# Patient Record
Sex: Female | Born: 1985 | Race: Black or African American | Hispanic: No | Marital: Married | Smoking: Never smoker
Health system: Southern US, Community
[De-identification: ages and names within clinical notes are randomized; demographics above are authoritative.]

## PROBLEM LIST (undated history)

## (undated) DIAGNOSIS — J309 Allergic rhinitis, unspecified: Secondary | ICD-10-CM

## (undated) DIAGNOSIS — J45909 Unspecified asthma, uncomplicated: Secondary | ICD-10-CM

## (undated) HISTORY — DX: Allergic rhinitis, unspecified: J30.9

## (undated) HISTORY — DX: Unspecified asthma, uncomplicated: J45.909

---

## 2013-08-19 ENCOUNTER — Emergency Department (HOSPITAL_COMMUNITY)
Admission: EM | Admit: 2013-08-19 | Discharge: 2013-08-19 | Disposition: A | Discharge status: Home / Self Care | Payer: BC Managed Care – PPO | Source: Home / Self Care | Attending: Family Medicine | Admitting: Family Medicine

## 2013-08-19 ENCOUNTER — Encounter (HOSPITAL_COMMUNITY): Payer: Self-pay | Admitting: Emergency Medicine

## 2013-08-19 DIAGNOSIS — K5289 Other specified noninfective gastroenteritis and colitis: Secondary | ICD-10-CM

## 2013-08-19 DIAGNOSIS — K529 Noninfective gastroenteritis and colitis, unspecified: Secondary | ICD-10-CM

## 2013-08-19 MED ORDER — ALIGN 4 MG PO CAPS: 1.0000 | ORAL_CAPSULE | Freq: Every day | ORAL | Status: DC

## 2013-08-19 MED ORDER — ONDANSETRON HCL 4 MG PO TABS: 4.0000 mg | ORAL_TABLET | Freq: Three times a day (TID) | ORAL | Status: DC | PRN

## 2013-08-19 NOTE — ED Provider Notes (Signed)
CSN: [id number]     Arrival date & time 08/19/13  1242 History   First MD Initiated Contact with Patient 08/19/13 1337     Chief Complaint  Patient presents with  . Abdominal Pain   (Consider location/radiation/quality/duration/timing/severity/associated sxs/prior Treatment) HPI Comments: Patient present with a 3 day history of abdominal cramping, nausea and diarrhea, fever/chills, and slight headache. No myalgias or URI sx. No known ill contacts. States her abdomen becomes "crampy and noisy" with meal intake. Denies GU symptoms. Non-bloody diarrhea. No recent travel, antibiotic use or raw meat/seafood.    No past medical history on file. No past surgical history on file. No family history on file. History  Substance Use Topics  . Smoking status: Not on file  . Smokeless tobacco: Not on file  . Alcohol Use: Not on file   OB History   No data available     Review of Systems  Constitutional: Positive for fever, chills and appetite change.  HENT: Negative.   Eyes: Negative.   Respiratory: Negative.   Cardiovascular: Negative.   Gastrointestinal: Positive for nausea and diarrhea. Negative for vomiting, constipation and blood in stool.       See HPI  Endocrine: Negative for polydipsia, polyphagia and polyuria.  Genitourinary: Negative.   Musculoskeletal: Negative.   Skin: Negative.   Neurological: Negative.     Allergies  Review of patient's allergies indicates no known allergies.  Home Medications  No current outpatient prescriptions on file. BP 114/74  Pulse 71  Temp(Src) 98.4 F (36.9 C) (Oral)  Resp 16  SpO2 100% Physical Exam  ED Course  Procedures (including critical care time) Labs Review Labs Reviewed - No data to display Imaging Review No results found.    MDM  Will treat with zofran, BRAT diet and Align and advised patient she can expect gradual resolution over next 2-3 days. Cautioned if she develops increasing pain, fever, persistent vomiting, or  blood in stool, she should be re-evaluated.     Jess BartersJennifer Lee [place], [place] 08/19/13 [address]1408  Dshawn Mcnay Lee [place], [place] 08/19/13 [phone]

## 2013-08-19 NOTE — ED Notes (Signed)
C/o abdominal pain, cramping, worse with eating.  Reports diarrhea and  nausea, no vomiting since onset of symptoms on Sunday.  Today has had no diarrhea, but soft stool and no vomiting

## 2013-08-27 NOTE — ED Provider Notes (Signed)
Medical screening examination/treatment/procedure(s) were performed by resident physician or non-physician practitioner and as supervising physician I was immediately available for consultation/collaboration.   Jachelle Fluty DOUGLAS MD.   Elsie Baynes D Alisha Burgo, MD 08/27/13 1756 

## 2017-04-26 ENCOUNTER — Ambulatory Visit (HOSPITAL_COMMUNITY)
Admission: EM | Admit: 2017-04-26 | Discharge: 2017-04-26 | Disposition: A | Discharge status: Home / Self Care | Payer: Self-pay

## 2017-10-12 ENCOUNTER — Ambulatory Visit: Payer: BC Managed Care – PPO | Admitting: Pulmonary Disease

## 2017-10-12 ENCOUNTER — Other Ambulatory Visit (INDEPENDENT_AMBULATORY_CARE_PROVIDER_SITE_OTHER): Payer: BC Managed Care – PPO

## 2017-10-12 ENCOUNTER — Telehealth: Payer: Self-pay | Admitting: Pulmonary Disease

## 2017-10-12 ENCOUNTER — Encounter: Payer: Self-pay | Admitting: Pulmonary Disease

## 2017-10-12 ENCOUNTER — Ambulatory Visit (INDEPENDENT_AMBULATORY_CARE_PROVIDER_SITE_OTHER)
Admission: RE | Admit: 2017-10-12 | Discharge: 2017-10-12 | Disposition: A | Discharge status: Home / Self Care | Payer: BC Managed Care – PPO | Source: Ambulatory Visit | Attending: Pulmonary Disease | Admitting: Pulmonary Disease

## 2017-10-12 VITALS — BP 124/72 | HR 84 | Ht 65.0 in | Wt 172.0 lb

## 2017-10-12 DIAGNOSIS — R05 Cough: Secondary | ICD-10-CM

## 2017-10-12 DIAGNOSIS — R059 Cough, unspecified: Secondary | ICD-10-CM

## 2017-10-12 LAB — CBC WITH DIFFERENTIAL/PLATELET
BASOS ABS: 0 10*3/uL (ref 0.0–0.1)
Basophils Relative: 0.4 % (ref 0.0–3.0)
Eosinophils Absolute: 0 10*3/uL (ref 0.0–0.7)
Eosinophils Relative: 0.8 % (ref 0.0–5.0)
HEMATOCRIT: 40.5 % (ref 36.0–46.0)
Hemoglobin: 13.7 g/dL (ref 12.0–15.0)
LYMPHS PCT: 25.4 % (ref 12.0–46.0)
Lymphs Abs: 1.5 10*3/uL (ref 0.7–4.0)
MCHC: 33.8 g/dL (ref 30.0–36.0)
MCV: 86.3 fl (ref 78.0–100.0)
MONOS PCT: 5.7 % (ref 3.0–12.0)
Monocytes Absolute: 0.3 10*3/uL (ref 0.1–1.0)
Neutro Abs: 4.1 10*3/uL (ref 1.4–7.7)
Neutrophils Relative %: 67.7 % (ref 43.0–77.0)
PLATELETS: 228 10*3/uL (ref 150.0–400.0)
RBC: 4.7 Mil/uL (ref 3.87–5.11)
RDW: 13.6 % (ref 11.5–15.5)
WBC: 6 10*3/uL (ref 4.0–10.5)

## 2017-10-12 LAB — NITRIC OXIDE: Nitric Oxide: 10

## 2017-10-12 MED ORDER — FLUTICASONE PROPIONATE 50 MCG/ACT NA SUSP: 2.0000 | Freq: Every day | NASAL | 2 refills | Status: DC

## 2017-10-12 MED ORDER — AZELASTINE HCL 0.1 % NA SOLN: 2.0000 | Freq: Two times a day (BID) | NASAL | 12 refills | Status: DC

## 2017-10-12 MED ORDER — CHLORPHENIRAMINE MALEATE 4 MG PO TABS: 8.0000 mg | ORAL_TABLET | Freq: Three times a day (TID) | ORAL | 0 refills | Status: DC

## 2017-10-12 MED ORDER — OMEPRAZOLE 40 MG PO CPDR: 40.0000 mg | DELAYED_RELEASE_CAPSULE | Freq: Every day | ORAL | 5 refills | Status: DC

## 2017-10-12 NOTE — Progress Notes (Signed)
Brandy MohCrystal Lehrmann    [id number]    18-Feb-1986  Primary Care Physician:Bland, Adrian SaranVeita, MD  Referring Physician: No referring provider defined for this encounter.  Chief complaint: Chronic cough  HPI: 32 year old with no significant past medical history.  Had a viral respiratory tract infection after a trip to [place] in January 2019.  He had a persistent cough since then.  Cough is nonproductive in nature associated with dyspnea.  Denies any wheeze.  No fevers, chills Seen by [facility] care and pul[facility] at [facility] and started on Symbicort which is not helping.  Reportedly had a chest x-ray in January which was normal She has history of childhood asthma with infrequent flares.  Reports worsening seasonal allergies, no GERD symptoms  Pets: One dog, no cats, birds Occupation: Professor of public health at [employer] Exposures: No mold, dampness.  Has a hot tub but does not use it Smoking history: Never smoker Travel History: Traveled to [place] in January 2019.  No other significant travel  Outpatient Encounter Medications as of 10/12/2017  Medication Sig  . benzonatate (TESSALON) 100 MG capsule TAKE 1 CAPSULE BY MOUTH THREE TIMES A DAY  . guaiFENesin-codeine 100-10 MG/5ML syrup TAKE 5MLS EVERY 12 HOURS AS NEEDED FOR COUGH  . ondansetron (ZOFRAN) 4 MG tablet Take 1 tablet (4 mg total) by mouth every 8 (eight) hours as needed for nausea or vomiting.  . SYMBICORT 80-4.5 MCG/ACT inhaler   . [DISCONTINUED] UNABLE TO FIND Red onions, pecans.  . [DISCONTINUED] Probiotic Product (ALIGN) 4 MG CAPS Take 1 capsule (4 mg total) by mouth daily. X 7 days   No facility-administered encounter medications on file as of 10/12/2017.     Allergies as of 10/12/2017 - Review Complete 08/19/2013  Allergen Reaction Noted  . Pecan nut (diagnostic)  10/12/2017    Past Medical History:  Diagnosis Date  . Allergic rhinitis   . Asthma     Past Surgical History:  Procedure Laterality Date  .  CHOLECYSTECTOMY      Family History  Problem Relation Age of Onset  . Breast cancer Maternal Grandmother   . Heart attack Maternal Grandmother   . Hypertension Maternal Grandmother     Social History   Socioeconomic History  . Marital status: Married    Spouse name: Not on file  . Number of children: Not on file  . Years of education: Not on file  . Highest education level: Not on file  Occupational History  . Not on file  Social Needs  . Financial resource strain: Not on file  . Food insecurity:    Worry: Not on file    Inability: Not on file  . Transportation needs:    Medical: Not on file    Non-medical: Not on file  Tobacco Use  . Smoking status: Never Smoker  . Smokeless tobacco: Never Used  Substance and Sexual Activity  . Alcohol use: No  . Drug use: No  . Sexual activity: Yes  Lifestyle  . Physical activity:    Days per week: Not on file    Minutes per session: Not on file  . Stress: Not on file  Relationships  . Social connections:    Talks on phone: Not on file    Gets together: Not on file    Attends religious service: Not on file    Active member of club or organization: Not on file    Attends meetings of clubs or organizations: Not  on file    Relationship status: Not on file  . Intimate partner violence:    Fear of current or ex partner: Not on file    Emotionally abused: Not on file    Physically abused: Not on file    Forced sexual activity: Not on file  Other Topics Concern  . Not on file  Social History Narrative  . Not on file    Review of systems: Review of Systems  Constitutional: Negative for fever and chills.  HENT: Negative.   Eyes: Negative for blurred vision.  Respiratory: as per HPI  Cardiovascular: Negative for chest pain and palpitations.  Gastrointestinal: Negative for vomiting, diarrhea, blood per rectum. Genitourinary: Negative for dysuria, urgency, frequency and hematuria.  Musculoskeletal: Negative for myalgias, back  pain and joint pain.  Skin: Negative for itching and rash.  Neurological: Negative for dizziness, tremors, focal weakness, seizures and loss of consciousness.  Endo/Heme/Allergies: Negative for environmental allergies.  Psychiatric/Behavioral: Negative for depression, suicidal ideas and hallucinations.  All other systems reviewed and are negative.  Physical Exam: Blood pressure 124/72, pulse 84, height 5\' 5"  (1.651 m), weight 172 lb (78 kg), SpO2 97 %. Gen:      No acute distress HEENT:  EOMI, sclera anicteric Neck:     No masses; no thyromegaly Lungs:    Clear to auscultation bilaterally; normal respiratory effort CV:         Regular rate and rhythm; no murmurs Abd:      + bowel sounds; soft, non-tender; no palpable masses, no distension Ext:    No edema; adequate peripheral perfusion Skin:      Warm and dry; no rash Neuro: alert and oriented x 3 Psych: normal mood and affect  Data Reviewed: FENO 10/12/17-10  Labs from primary care 08/10/17-WBC 7.8, eos 1%, absolute eosinophil count 78  Assessment:  Chronic cough Likely postinflammatory from viral bronchitis in January.  She has persistent cough which may be exacerbated by postnasal drip and acid reflux Suspicion for asthma is low.  However I told her to continue Symbicort for now.  Check chest x-ray, PFTs, CBC differential, blood allergy profile Start chlorpheniramine 8 mg 3 times daily, Flonase, Astelin nasal spray for postnasal drip. Start Prilosec 40 mg once daily for silent reflux.  I educated her on behavioral changes to deal with cough including conscious suppression of the urge to cough, use of throat lozenges. Follow-up in 2-4 weeks for reassessment.  Plan/Recommendations: - Continue Symbicort - CBC differential, blood allergy profile, chest x-ray, PFTs - Start chlorphentermine, Flonase, Astelin, Prilosec  Chilton Greathouse MD [facility] Pager [phone] 10/12/2017, 9:22 AM  CC: No ref.  provider found

## 2017-10-12 NOTE — Patient Instructions (Addendum)
Check CBC differential and blood allergy profile Schedule you for PFTs in 2-4 weeks with follow-up after that Check CBC differential, blood allergy profile, chest x-ray Continue the Symbicort We will start you on chlorpheniramine 8 mg 3 times daily and Flonase, Astelin nasal spray Start you on Prilosec 40 mg once daily Follow-up in 2-4 weeks.

## 2017-10-12 NOTE — Telephone Encounter (Signed)
Called and spoke with patient, she is aware of results from the cxr and verbalized understanding. Still awaiting blood work. Will call patient once we have those.

## 2017-10-15 LAB — RESPIRATORY ALLERGY PROFILE REGION II ~~LOC~~
ALLERGEN, D PTERNOYSSINUS, D1: 5.38 kU/L — AB
Allergen, A. alternata, m6: <0.1 kU/L
Allergen, Cedar tree, t12: 0.14 kU/L — ABNORMAL HIGH
Allergen, Comm Silver Birch, t9: <0.1 kU/L
Allergen, Cottonwood, t14: <0.1 kU/L
Allergen, Mulberry, t76: <0.1 kU/L
Bermuda Grass: 0.15 kU/L — ABNORMAL HIGH
CLADOSPORIUM HERBARUM (M2) IGE: <0.1 kU/L
CLASS: 0
CLASS: 0
CLASS: 0
CLASS: 2
CLASS: 3
COCKROACH: 2.33 kU/L — AB
COMMON RAGWEED (SHORT) (W1) IGE: <0.1 kU/L
Cat Dander: 3.36 kU/L — ABNORMAL HIGH
Class: 0
Class: 0
Class: 0
Class: 0
Class: 0
Class: 0
Class: 0
Class: 0
Class: 0
Class: 0
Class: 0
Class: 0
Class: 0
Class: 0
Class: 0
Class: 1
Class: 1
Class: 2
Class: 3
D. farinae: 5.43 kU/L — ABNORMAL HIGH
Dog Dander: 0.63 kU/L — ABNORMAL HIGH
IgE (Immunoglobulin E), Serum: 552 kU/L — ABNORMAL HIGH (ref <=114)
Johnson Grass: 0.18 kU/L — ABNORMAL HIGH
Pecan/Hickory Tree IgE: <0.1 kU/L
Sheep Sorrel IgE: <0.1 kU/L
TIMOTHY GRASS: 0.4 kU/L — AB

## 2017-10-15 LAB — INTERPRETATION:

## 2017-11-08 ENCOUNTER — Encounter (HOSPITAL_COMMUNITY): Payer: Self-pay | Admitting: Family Medicine

## 2017-11-08 ENCOUNTER — Ambulatory Visit (HOSPITAL_COMMUNITY)
Admission: EM | Admit: 2017-11-08 | Discharge: 2017-11-08 | Disposition: A | Discharge status: Home / Self Care | Payer: BC Managed Care – PPO | Attending: Internal Medicine | Admitting: Internal Medicine

## 2017-11-08 DIAGNOSIS — M545 Low back pain, unspecified: Secondary | ICD-10-CM

## 2017-11-08 DIAGNOSIS — S060X0A Concussion without loss of consciousness, initial encounter: Secondary | ICD-10-CM | POA: Diagnosis not present

## 2017-11-08 MED ORDER — CYCLOBENZAPRINE HCL 10 MG PO TABS: 10.0000 mg | ORAL_TABLET | Freq: Two times a day (BID) | ORAL | 0 refills | Status: DC | PRN
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
## 2017-11-08 NOTE — Discharge Instructions (Signed)
Your headache symptoms appear that you have a concussion.  Please avoid screens as much as possible, if you are able to stay home from work and rest throughout the weekend that is recommended.  Please take Tylenol ibuprofen for headache as needed.  If you are partaking in activity that is worsening your headache, please take a break.  Use anti-inflammatories for pain/swelling. You may take up to 800 mg Ibuprofen every 8 hours with food. You may supplement Ibuprofen with Tylenol [phone] mg every 8 hours.   He may use Flexeril as needed to help with back discomfort.  This may cause some sedation, please limit use to bedtime.  You may cut this in half if it is too sedating.

## 2017-11-09 ENCOUNTER — Ambulatory Visit: Payer: BC Managed Care – PPO | Admitting: Pulmonary Disease

## 2017-11-16 ENCOUNTER — Emergency Department (HOSPITAL_COMMUNITY)
Admission: EM | Admit: 2017-11-16 | Discharge: 2017-11-17 | Disposition: A | Discharge status: Home / Self Care | Payer: BC Managed Care – PPO | Attending: Emergency Medicine | Admitting: Emergency Medicine

## 2017-11-16 ENCOUNTER — Encounter (HOSPITAL_COMMUNITY): Payer: Self-pay

## 2017-11-16 ENCOUNTER — Other Ambulatory Visit: Payer: Self-pay

## 2017-11-16 DIAGNOSIS — Y999 Unspecified external cause status: Secondary | ICD-10-CM | POA: Insufficient documentation

## 2017-11-16 DIAGNOSIS — Z79899 Other long term (current) drug therapy: Secondary | ICD-10-CM | POA: Insufficient documentation

## 2017-11-16 DIAGNOSIS — Y929 Unspecified place or not applicable: Secondary | ICD-10-CM | POA: Insufficient documentation

## 2017-11-16 DIAGNOSIS — F0781 Postconcussional syndrome: Secondary | ICD-10-CM | POA: Diagnosis not present

## 2017-11-16 DIAGNOSIS — J45909 Unspecified asthma, uncomplicated: Secondary | ICD-10-CM | POA: Diagnosis not present

## 2017-11-16 DIAGNOSIS — Y939 Activity, unspecified: Secondary | ICD-10-CM | POA: Insufficient documentation

## 2017-11-16 DIAGNOSIS — R51 Headache: Secondary | ICD-10-CM | POA: Diagnosis present

## 2017-11-16 DIAGNOSIS — S161XXA Strain of muscle, fascia and tendon at neck level, initial encounter: Secondary | ICD-10-CM

## 2017-11-16 MED ORDER — MECLIZINE HCL 25 MG PO TABS: 25.0000 mg | ORAL_TABLET | Freq: Three times a day (TID) | ORAL | 0 refills | Status: DC | PRN

## 2017-11-16 MED ORDER — MELOXICAM 15 MG PO TABS: 15.0000 mg | ORAL_TABLET | Freq: Every day | ORAL | 0 refills | Status: DC
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
## 2017-11-16 NOTE — Discharge Instructions (Addendum)
Get help right away if: °You have confusion or unusual drowsiness. °Others find it difficult to wake you up. °You have nausea or persistent, forceful vomiting. °You feel like you are moving when you are not (vertigo). Your eyes may move rapidly back and forth. °You have convulsions or faint. °You have severe, persistent headaches that are not relieved by medicine. °You cannot use your arms or legs normally. °One of your pupils is larger than the other. °You have clear or bloody discharge from your nose or ears. °Your problems are getting worse, not better. °

## 2017-11-16 NOTE — ED Notes (Signed)
Pt is a one touch pt, see provider's assessment.  Pt reports a headache and back pain.
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
## 2017-11-20 ENCOUNTER — Ambulatory Visit: Payer: BC Managed Care – PPO | Admitting: Family Medicine

## 2017-11-20 ENCOUNTER — Encounter: Payer: Self-pay | Admitting: Family Medicine

## 2017-11-20 VITALS — BP 100/80 | HR 79 | Ht 66.0 in | Wt 173.0 lb

## 2017-11-20 DIAGNOSIS — S060X0D Concussion without loss of consciousness, subsequent encounter: Secondary | ICD-10-CM | POA: Diagnosis not present

## 2017-11-20 DIAGNOSIS — S060X0A Concussion without loss of consciousness, initial encounter: Secondary | ICD-10-CM | POA: Insufficient documentation
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
## 2017-11-20 NOTE — Patient Instructions (Signed)
Nice to meet you  Please try light exercise You can try using electronics but quit using them if you start having symptoms.  Please follow up with me in 2-3 weeks.
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
## 2017-12-13 ENCOUNTER — Ambulatory Visit: Payer: BC Managed Care – PPO | Attending: Family Medicine | Admitting: Physical Therapy

## 2017-12-13 ENCOUNTER — Encounter: Payer: Self-pay | Admitting: Physical Therapy

## 2017-12-13 ENCOUNTER — Other Ambulatory Visit: Payer: Self-pay

## 2017-12-13 DIAGNOSIS — R29818 Other symptoms and signs involving the nervous system: Secondary | ICD-10-CM | POA: Insufficient documentation
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
## 2017-12-18 ENCOUNTER — Encounter

## 2017-12-24 ENCOUNTER — Ambulatory Visit: Payer: BC Managed Care – PPO | Admitting: Physical Therapy

## 2018-06-15 ENCOUNTER — Encounter (HOSPITAL_COMMUNITY): Payer: Self-pay | Admitting: Emergency Medicine

## 2018-06-15 ENCOUNTER — Emergency Department (HOSPITAL_COMMUNITY)
Admission: EM | Admit: 2018-06-15 | Discharge: 2018-06-16 | Disposition: A | Discharge status: Home / Self Care | Payer: BC Managed Care – PPO | Attending: Emergency Medicine | Admitting: Emergency Medicine

## 2018-06-15 ENCOUNTER — Ambulatory Visit (HOSPITAL_COMMUNITY)
Admission: EM | Admit: 2018-06-15 | Discharge: 2018-06-15 | Disposition: A | Discharge status: Home / Self Care | Payer: BC Managed Care – PPO | Source: Home / Self Care

## 2018-06-15 ENCOUNTER — Encounter (HOSPITAL_COMMUNITY): Payer: Self-pay

## 2018-06-15 DIAGNOSIS — R1013 Epigastric pain: Secondary | ICD-10-CM

## 2018-06-15 DIAGNOSIS — R197 Diarrhea, unspecified: Secondary | ICD-10-CM

## 2018-06-15 DIAGNOSIS — Z7951 Long term (current) use of inhaled steroids: Secondary | ICD-10-CM

## 2018-06-15 DIAGNOSIS — Z79899 Other long term (current) drug therapy: Secondary | ICD-10-CM | POA: Insufficient documentation

## 2018-06-15 DIAGNOSIS — K921 Melena: Secondary | ICD-10-CM | POA: Insufficient documentation

## 2018-06-15 DIAGNOSIS — J45909 Unspecified asthma, uncomplicated: Secondary | ICD-10-CM | POA: Insufficient documentation

## 2018-06-15 LAB — COMPREHENSIVE METABOLIC PANEL
ALT: 31 U/L (ref 0–44)
AST: 33 U/L (ref 15–41)
Albumin: 3.9 g/dL (ref 3.5–5.0)
Alkaline Phosphatase: 44 U/L (ref 38–126)
Anion gap: 12 (ref 5–15)
BUN: 9 mg/dL (ref 6–20)
CHLORIDE: 99 mmol/L (ref 98–111)
CO2: 24 mmol/L (ref 22–32)
Calcium: 8.9 mg/dL (ref 8.9–10.3)
Creatinine, Ser: 0.95 mg/dL (ref 0.44–1.00)
GFR calc Af Amer: >60 mL/min (ref >60)
Glucose, Bld: 108 mg/dL — ABNORMAL HIGH (ref 70–99)
Potassium: 3.9 mmol/L (ref 3.5–5.1)
SODIUM: 135 mmol/L (ref 135–145)
Total Bilirubin: 0.8 mg/dL (ref 0.3–1.2)
Total Protein: 7.8 g/dL (ref 6.5–8.1)

## 2018-06-15 LAB — I-STAT BETA HCG BLOOD, ED (MC, WL, AP ONLY)

## 2018-06-15 LAB — POC OCCULT BLOOD, ED: Fecal Occult Bld: POSITIVE — AB

## 2018-06-15 MED ORDER — METOCLOPRAMIDE HCL 5 MG/ML IJ SOLN
10.0000 mg | INTRAMUSCULAR | Status: AC
  Administered 2018-06-15: 10 mg via INTRAVENOUS
  Filled 2018-06-15: qty 2

## 2018-06-15 MED ORDER — DICYCLOMINE HCL 10 MG/ML IM SOLN
20.0000 mg | Freq: Once | INTRAMUSCULAR | Status: AC
Start: 2018-06-15 — End: 2018-06-15
  Administered 2018-06-15: 20 mg via INTRAMUSCULAR
  Filled 2018-06-15: qty 2

## 2018-06-15 MED ORDER — OMEPRAZOLE 20 MG PO CPDR: 20.0000 mg | DELAYED_RELEASE_CAPSULE | Freq: Every day | ORAL | 0 refills | Status: AC

## 2018-06-15 MED ORDER — SUCRALFATE 1 G PO TABS: 1.0000 g | ORAL_TABLET | Freq: Three times a day (TID) | ORAL | 0 refills | Status: AC

## 2018-06-15 MED ORDER — FAMOTIDINE IN NACL 20-0.9 MG/50ML-% IV SOLN
20.0000 mg | INTRAVENOUS | Status: AC
  Administered 2018-06-15: 20 mg via INTRAVENOUS
  Filled 2018-06-15: qty 50

## 2018-06-15 MED ORDER — SODIUM CHLORIDE 0.9 % IV BOLUS
2000.0000 mL | Freq: Once | INTRAVENOUS | Status: AC
  Administered 2018-06-15: 2000 mL via INTRAVENOUS

## 2018-06-15 MED ORDER — AZITHROMYCIN 250 MG PO TABS: 1000.0000 mg | ORAL_TABLET | Freq: Once | ORAL | 0 refills | Status: AC

## 2018-06-15 MED ORDER — ONDANSETRON 4 MG PO TBDP: 4.0000 mg | ORAL_TABLET | Freq: Three times a day (TID) | ORAL | 0 refills | Status: DC | PRN

## 2018-06-15 NOTE — ED Notes (Signed)
Recollected cbc

## 2018-06-15 NOTE — ED Provider Notes (Signed)
[facility] EMERGENCY DEPARTMENT Provider Note   CSN: [id number] Arrival date & time: 06/15/18  2157     History   Chief Complaint Chief Complaint  Patient presents with  . Abdominal Pain    HPI Brandy Macias is a 32 y.o. female.   32 year old female presents to the emergency department for evaluation of abdominal pain and diarrhea.  Symptoms have been persistent over the past 3 days.  She notes onset of symptoms Thursday morning.  Diarrhea has been watery and became bloody yesterday.  Notes gross bright red blood and mucus with very little stool.  Has also been experiencing waxing and waning, crampy, sharp pain in her periumbilical and epigastric abdomen.  Had one episode of vomiting today after taking 4 tablets of azithromycin prescribed at urgent care.  Has, otherwise, not had any vomiting.  No fevers, chills, urinary symptoms, hx of abdominal surgeries.  Denies eating any undercooked meats.  Further denies any recent antibiotic use prior to the 4 tablets of azithromycin taken today.  No recent travel or drinking from questionable water sources.  Has a history of colonoscopy 10 years ago.     Past Medical History:  Diagnosis Date  . Allergic rhinitis   . Asthma     Patient Active Problem List   Diagnosis Date Noted  . Concussion with no loss of consciousness 11/20/2017    History reviewed. No pertinent surgical history.   OB History   None      Home Medications    Prior to Admission medications   Medication Sig Start Date End Date Taking? Authorizing Provider  dicyclomine (BENTYL) 20 MG tablet Take 1 tablet (20 mg total) by mouth every 12 (twelve) hours as needed (for abdominal pain/cramping). 06/16/18   Antony Madura, PA-C  guaiFENesin-codeine 100-10 MG/5ML syrup TAKE EVERY 12 HOURS AS NEEDED FOR COUGH 09/17/17   [provider]  Lactobacillus (LACTINEX) PACK Mix 1/2 packet with soft food and take twice a day for 5 days. 06/16/18  06/21/18  Antony Madura, PA-C  omeprazole (PRILOSEC) 20 MG capsule Take 1 capsule (20 mg total) by mouth daily. 06/15/18   Wieters, Hallie C, PA-C  ondansetron (ZOFRAN ODT) 4 MG disintegrating tablet Take 1 tablet (4 mg total) by mouth every 8 (eight) hours as needed for nausea or vomiting. 06/15/18   Wieters, Hallie C, PA-C  sucralfate (CARAFATE) 1 g tablet Take 1 tablet (1 g total) by mouth 4 (four) times daily -  with meals and at bedtime. 06/15/18   Wieters, Junius Creamer, PA-C  SYMBICORT 80-4.5 MCG/ACT inhaler  08/09/17   [provider]    Family History Family History  Problem Relation Age of Onset  . Breast cancer Maternal Grandmother   . Heart attack Maternal Grandmother   . Hypertension Maternal Grandmother     Social History Social History   Tobacco Use  . Smoking status: Never Smoker  . Smokeless tobacco: Never Used  Substance Use Topics  . Alcohol use: Yes    Comment: rare  . Drug use: No     Allergies   Pecan nut (diagnostic)   Review of Systems Review of Systems Ten systems reviewed and are negative for acute change, except as noted in the HPI.    Physical Exam Updated Vital Signs BP 100/65 (BP Location: Right Arm)   Pulse 71   Temp 98 F (36.7 C) (Oral)   Resp 16   Ht 5\' 6"  (1.676 m)   Wt 77.1 kg  LMP 05/24/2018   SpO2 99%   BMI 27.44 kg/m   Physical Exam  Constitutional: She is oriented to person, place, and time. She appears well-developed and well-nourished. No distress.  Nontoxic appearing and in NAD  HENT:  Head: Normocephalic and atraumatic.  Eyes: Conjunctivae and EOM are normal. No scleral icterus.  Neck: Normal range of motion.  Cardiovascular: Normal rate, regular rhythm and intact distal pulses.  Pulmonary/Chest: Effort normal. No stridor. No respiratory distress. She has no wheezes. She has no rales.  Respirations even and unlabored. Lungs CTAB.  Abdominal: Soft. She exhibits no mass. There is tenderness (epigastrium). There  is no guarding.  Focal tenderness in the epigastrium.  Mild voluntary guarding.  No peritoneal signs.  Abdomen nondistended.  Musculoskeletal: Normal range of motion.  Neurological: She is alert and oriented to person, place, and time. She exhibits normal muscle tone. Coordination normal.  Skin: Skin is warm and dry. No rash noted. She is not diaphoretic. No erythema. No pallor.  Psychiatric: She has a normal mood and affect. Her behavior is normal.  Nursing note and vitals reviewed.    ED Treatments / Results  Labs (all labs ordered are listed, but only abnormal results are displayed) Labs Reviewed  COMPREHENSIVE METABOLIC PANEL - Abnormal; Notable for the following components:      Result Value   Glucose, Bld 108 (*)    All other components within normal limits  CBC WITH DIFFERENTIAL/PLATELET - Abnormal; Notable for the following components:   Hemoglobin 11.9 (*)    All other components within normal limits  POC OCCULT BLOOD, ED - Abnormal; Notable for the following components:   Fecal Occult Bld POSITIVE (*)    All other components within normal limits  CBC WITH DIFFERENTIAL/PLATELET  I-STAT BETA HCG BLOOD, ED ([facility], [facility], [facility] ONLY)    EKG None  Radiology No results found.  Procedures Procedures (including critical care time)  Medications Ordered in ED Medications  sodium chloride 0.9 % bolus 2,000 mL (0 mLs Intravenous Stopped 06/16/18 0024)  metoCLOPramide (REGLAN) injection 10 mg (10 mg Intravenous Given 06/15/18 2235)  famotidine (PEPCID) IVPB 20 mg premix (0 mg Intravenous Stopped 06/15/18 2311)  dicyclomine (BENTYL) injection 20 mg (20 mg Intramuscular Given 06/15/18 2235)    12:40 AM Patient reassessed.  She is resting comfortably.  She states that her symptoms have greatly improved following IV fluid hydration and Bentyl.     Initial Impression / Assessment and Plan / ED Course  I have reviewed the triage vital signs and the nursing notes.  Pertinent labs &  imaging results that were available during my care of the patient were reviewed by me and considered in my medical decision making (see chart for details).     32 year old female presents to the emergency department for evaluation of diarrhea which began 3 days ago.  She began to notice bloody diarrhea yesterday.  This has been persistent.  No reports of melena.  She was seen at urgent care earlier today for similar complaints.  Was given 1 g azithromycin and referred to GI.  Came to the emergency department tonight secondary to worsening abdominal cramping.  She has had good symptomatic control following 2L IV fluid hydration, Bentyl, Reglan.  Noted to have a slight drop in her hemoglobin from baseline, but overall hemodynamically stable without tachycardia or hypotension.  No electrolyte derangements.  Liver and kidney function preserved.  The patient has a GI pathogen panel that is pending.  No signs of  acute surgical abdomen to warrant additional imaging at this time.  She does not meet SIRS or Sepsis criteria.  Suspect bloody diarrhea to be secondary to bacterial or viral etiology, less likely IBD.  Have counseled on the use of probiotics as well as the need for adequate fluid hydration.  Will also give a prescription for Bentyl.  Encouraged primary care and GI follow-up.  Return precautions discussed and provided. Patient discharged in stable condition with no unaddressed concerns.   Final Clinical Impressions(s) / ED Diagnoses   Final diagnoses:  Bloody diarrhea    ED Discharge Orders         Ordered    Lactobacillus (LACTINEX) PACK     06/16/18 0041    dicyclomine (BENTYL) 20 MG tablet  Every 12 hours PRN     06/16/18 0041           Antony MaduraHumes, Miya Luviano, PA-C 06/16/18 0100    Pricilla LovelessGoldston, Scott, MD 06/16/18 1525

## 2018-06-15 NOTE — Discharge Instructions (Signed)
Please take 4 tablets of azithromycin today  Please use zofran as needed for nausea and vomiting Begin carafate before meals and bedtime (or 4 times daily to coat stomach if there is an ulcer) Please also try omeprazole daily for 2 weeks  Push fluids, pedialyte, begin with bland diet as tolerating liquids  We will send stool off to check for infectious causes Please follow-up in emergency room if having worsening pain, signs of dehydration including lightheadedness, dizziness, weakness, fatigue

## 2018-06-15 NOTE — ED Triage Notes (Signed)
Patient reports intermittent epigastric pain with emesis and dark colored diarrhea onset Thursday , denies dysuria , no fever or chills .

## 2018-06-15 NOTE — ED Triage Notes (Signed)
Pt presents with upper abdominal pain, blood in stool and nausea with fever.

## 2018-06-16 ENCOUNTER — Telehealth (HOSPITAL_COMMUNITY): Payer: Self-pay | Admitting: *Deleted

## 2018-06-16 LAB — GASTROINTESTINAL PANEL BY PCR, STOOL (REPLACES STOOL CULTURE)
ASTROVIRUS: NOT DETECTED
Adenovirus F40/41: NOT DETECTED
Campylobacter species: NOT DETECTED
Cryptosporidium: NOT DETECTED
Cyclospora cayetanensis: NOT DETECTED
E. COLI O157: DETECTED — AB
ENTAMOEBA HISTOLYTICA: NOT DETECTED
ENTEROAGGREGATIVE E COLI (EAEC): NOT DETECTED
Enterotoxigenic E coli (ETEC): NOT DETECTED
Giardia lamblia: NOT DETECTED
Norovirus GI/GII: NOT DETECTED
Plesimonas shigelloides: NOT DETECTED
ROTAVIRUS A: NOT DETECTED
Salmonella species: NOT DETECTED
Sapovirus (I, II, IV, and V): NOT DETECTED
Shiga like toxin producing E coli (STEC): DETECTED — AB
Shigella/Enteroinvasive E coli (EIEC): NOT DETECTED
VIBRIO CHOLERAE: NOT DETECTED
Vibrio species: NOT DETECTED
Yersinia enterocolitica: NOT DETECTED

## 2018-06-16 LAB — CBC WITH DIFFERENTIAL/PLATELET
Abs Immature Granulocytes: 0.01 10*3/uL (ref 0.00–0.07)
Basophils Absolute: 0 10*3/uL (ref 0.0–0.1)
Basophils Relative: 0 %
EOS ABS: 0 10*3/uL (ref 0.0–0.5)
Eosinophils Relative: 0 %
HCT: 38.3 % (ref 36.0–46.0)
Hemoglobin: 11.9 g/dL — ABNORMAL LOW (ref 12.0–15.0)
IMMATURE GRANULOCYTES: 0 %
Lymphocytes Relative: 10 %
Lymphs Abs: 0.9 10*3/uL (ref 0.7–4.0)
MCH: 27.2 pg (ref 26.0–34.0)
MCHC: 31.1 g/dL (ref 30.0–36.0)
MCV: 87.4 fL (ref 80.0–100.0)
MONOS PCT: 7 %
Monocytes Absolute: 0.7 10*3/uL (ref 0.1–1.0)
Neutro Abs: 7.7 10*3/uL (ref 1.7–7.7)
Neutrophils Relative %: 83 %
Platelets: 195 10*3/uL (ref 150–400)
RBC: 4.38 MIL/uL (ref 3.87–5.11)
RDW: 12.4 % (ref 11.5–15.5)
WBC: 9.3 10*3/uL (ref 4.0–10.5)
nRBC: 0 % (ref 0.0–0.2)

## 2018-06-16 MED ORDER — LACTINEX PO PACK: PACK | ORAL | 0 refills | Status: AC

## 2018-06-16 MED ORDER — DICYCLOMINE HCL 20 MG PO TABS: 20.0000 mg | ORAL_TABLET | Freq: Two times a day (BID) | ORAL | 0 refills | Status: AC | PRN

## 2018-06-16 NOTE — Telephone Encounter (Signed)
Pt inquiring about lab results from yesterday.  Discussed and reviewed with H. Wieters, [place]. Per PA instructions: Informed pt of positive E. Coli results, and that bloody diarrhea is common with this strain;  Discussed increasing iron in diet, stressed staying well hydrated with non-caffeinated beverages, avoiding fruit juices and milk products.  Pt states she has continued with bloody diarrhea, but the amount of blood seems to be slightly less.  Continues to have abd cramping; instructed to continue dicyclomine as prescribed in ED.  Instructed to continue monitoring sxs, and to get re-evaluated in ED for any worsening sxs, such as increased pain, dizziness, lightheadedness, passing large amounts of blood, worsening bleeding, or any other concerns, but to expect gradual improvement over next few days.  Pt c/o difficulty sleeping; Per PA, pt informed she may take OTC Benadryl or Tyl PM to help her sleep.  Pt verbalized understanding of all instructions.

## 2018-06-16 NOTE — ED Provider Notes (Signed)
[facility]    CSN: [id number] Arrival date & time: 06/15/18  1151     History   Chief Complaint Chief Complaint  Patient presents with  . Abdominal Pain  . Blood in Stool    HPI Brandy Macias is a 32 y.o. female history of allergic rhinitis and asthma presenting today for upper abdominal pain and bloody diarrhea.  Patient states that over the past 3 to 4 days she has had diarrhea, upper abdominal pain as well as fevers.  She noted a fever of 101.9 yesterday.  She has had blood in the stool, initially it was mainly diarrhea, but over the past couple days it has developed more into blood.  She denies any recent out of the country travel.  Denies any recent antibiotic use.  She has had a sharp pain in her upper abdomen which is debilitating her.  She denies any vomiting, but has had some occasional nausea.  Nobody else at home with similar symptoms.  She has not taken anything.  Poor oral intake due to abdominal discomfort.  HPI  Past Medical History:  Diagnosis Date  . Allergic rhinitis   . Asthma     Patient Active Problem List   Diagnosis Date Noted  . Concussion with no loss of consciousness 11/20/2017    History reviewed. No pertinent surgical history.  OB History   None      Home Medications    Prior to Admission medications   Medication Sig Start Date End Date Taking? Authorizing Provider  dicyclomine (BENTYL) 20 MG tablet Take 1 tablet (20 mg total) by mouth every 12 (twelve) hours as needed (for abdominal pain/cramping). 06/16/18   Antony Madura, PA-C  guaiFENesin-codeine 100-10 MG/5ML syrup TAKE EVERY 12 HOURS AS NEEDED FOR COUGH 09/17/17   [provider]  Lactobacillus (LACTINEX) PACK Mix 1/2 packet with soft food and take twice a day for 5 days. 06/16/18 06/21/18  Antony Madura, PA-C  omeprazole (PRILOSEC) 20 MG capsule Take 1 capsule (20 mg total) by mouth daily. 06/15/18   Hazelene Doten C, PA-C  ondansetron (ZOFRAN ODT) 4 MG  disintegrating tablet Take 1 tablet (4 mg total) by mouth every 8 (eight) hours as needed for nausea or vomiting. 06/15/18   Cashlynn Yearwood C, PA-C  sucralfate (CARAFATE) 1 g tablet Take 1 tablet (1 g total) by mouth 4 (four) times daily -  with meals and at bedtime. 06/15/18   Jennyfer Nickolson, Junius Creamer, PA-C  SYMBICORT 80-4.5 MCG/ACT inhaler  08/09/17   [provider]    Family History Family History  Problem Relation Age of Onset  . Breast cancer Maternal Grandmother   . Heart attack Maternal Grandmother   . Hypertension Maternal Grandmother     Social History Social History   Tobacco Use  . Smoking status: Never Smoker  . Smokeless tobacco: Never Used  Substance Use Topics  . Alcohol use: Yes    Comment: rare  . Drug use: No     Allergies   Pecan nut (diagnostic)   Review of Systems Review of Systems  Constitutional: Negative for activity change, appetite change, chills, fatigue and fever.  HENT: Negative for congestion, ear pain, rhinorrhea, sinus pressure, sore throat and trouble swallowing.   Eyes: Negative for discharge and redness.  Respiratory: Negative for cough, chest tightness and shortness of breath.   Cardiovascular: Negative for chest pain.  Gastrointestinal: Positive for abdominal pain, diarrhea and nausea. Negative for vomiting.  Musculoskeletal: Negative for myalgias.  Skin: Negative for rash.  Neurological: Negative for dizziness, light-headedness and headaches.     Physical Exam Triage Vital Signs ED Triage Vitals  Enc Vitals Group     BP 06/15/18 1328 118/68     Pulse Rate 06/15/18 1328 85     Resp 06/15/18 1328 20     Temp 06/15/18 1328 98.4 F (36.9 C)     Temp Source 06/15/18 1328 Oral     SpO2 06/15/18 1328 100 %     Weight --      Height --      Head Circumference --      Peak Flow --      Pain Score 06/15/18 1326 9     Pain Loc --      Pain Edu? --      Excl. in GC? --    No data found.  Updated Vital Signs BP 118/68 (BP  Location: Right Arm)   Pulse 85   Temp 98.4 F (36.9 C) (Oral)   Resp 20   LMP 04/23/2018   SpO2 100%   Visual Acuity Right Eye Distance:   Left Eye Distance:   Bilateral Distance:    Right Eye Near:   Left Eye Near:    Bilateral Near:     Physical Exam  Constitutional: She is oriented to person, place, and time. She appears well-developed and well-nourished.  Lying on exam table, holding epigastrium of stomach, appears uncomfortable, but nontoxic-appearing  HENT:  Head: Normocephalic and atraumatic.  Oral mucosa pink and moist, no tonsillar enlargement or exudate. Posterior pharynx patent and nonerythematous, no uvula deviation or swelling. Normal phonation.  Eyes: Conjunctivae are normal.  Neck: Neck supple.  Cardiovascular: Normal rate and regular rhythm.  No murmur heard. Pulmonary/Chest: Effort normal and breath sounds normal. No respiratory distress.  Abdominal: Soft. There is tenderness.  Tenderness throughout bilateral right and left upper quadrants, more concentrated towards epigastrium, nontender to bilateral lower quadrants, negative McBurney's, negative Murphy's, negative rebound  Genitourinary:  Genitourinary Comments: Bloody mucoid bowel movement  Musculoskeletal: She exhibits no edema.  Neurological: She is alert and oriented to person, place, and time.  Skin: Skin is warm and dry.  Psychiatric: She has a normal mood and affect.  Nursing note and vitals reviewed.    UC Treatments / Results  Labs (all labs ordered are listed, but only abnormal results are displayed) Labs Reviewed  GASTROINTESTINAL PANEL BY PCR, STOOL (REPLACES STOOL CULTURE)    EKG None  Radiology No results found.  Procedures Procedures (including critical care time)  Medications Ordered in UC Medications - No data to display  Initial Impression / Assessment and Plan / UC Course  I have reviewed the triage vital signs and the nursing notes.  Pertinent labs & imaging results  that were available during my care of the patient were reviewed by me and considered in my medical decision making (see chart for details).    Symptoms seem most likely infectious given associated fever and acute onset, given blood and diarrhea will obtain GI pathogen panel to send off, small amount obtained, will put future order in case insufficient sample.  Will treat with 1 g azithromycin as recommended by up-to-date.  Will provide Zofran to use as needed for nausea.  Given epigastric pain we will try Carafate and PPI to help reduce discomfort in case of underlying gastritis/ulcer leading to bleeding.  Pushing fluids in order to prevent dehydration.  Follow-up in emergency room if symptoms worsening, signs  of dehydration, blood loss.  Follow-up if symptoms persisting, follow-up with GI for further evaluation of bleeding.Discussed strict return precautions. Patient verbalized understanding and is agreeable with plan.  Final Clinical Impressions(s) / UC Diagnoses   Final diagnoses:  Epigastric pain  Diarrhea of presumed infectious origin     Discharge Instructions     Please take 4 tablets of azithromycin today  Please use zofran as needed for nausea and vomiting Begin carafate before meals and bedtime (or 4 times daily to coat stomach if there is an ulcer) Please also try omeprazole daily for 2 weeks  Push fluids, pedialyte, begin with bland diet as tolerating liquids  We will send stool off to check for infectious causes Please follow-up in emergency room if having worsening pain, signs of dehydration including lightheadedness, dizziness, weakness, fatigue   ED Prescriptions    Medication Sig Dispense Auth. Provider   azithromycin (ZITHROMAX) 250 MG tablet Take 4 tablets (1,000 mg total) by mouth once for 1 dose. 4 tablet Chyrl Elwell C, PA-C   ondansetron (ZOFRAN ODT) 4 MG disintegrating tablet Take 1 tablet (4 mg total) by mouth every 8 (eight) hours as needed for nausea or  vomiting. 20 tablet Greenley Martone C, PA-C   sucralfate (CARAFATE) 1 g tablet Take 1 tablet (1 g total) by mouth 4 (four) times daily -  with meals and at bedtime. 30 tablet Ivey Nembhard C, PA-C   omeprazole (PRILOSEC) 20 MG capsule Take 1 capsule (20 mg total) by mouth daily. 14 capsule Kelcy Baeten C, PA-C     Controlled Substance Prescriptions [place] Controlled Substance Registry consulted? Not Applicable   Lew Dawes, [place] 06/16/18 1610

## 2018-06-16 NOTE — Discharge Instructions (Signed)
We recommend use of a probiotic daily such as Lactinex.  You may, instead, use an over-the-counter probiotic if desired.  You have been prescribed Bentyl to take for management of abdominal pain/spasms.  Use as prescribed.  You may use 1000 mg Tylenol every 8 hours as needed for persistent abdominal pain or cramping.  Be sure to drink plenty of clear liquids to prevent dehydration.  Follow up with a gastroenterologist and your primary care doctor to ensure resolution of symptoms.

## 2018-09-11 LAB — MISCELLANEOUS TEST

## 2019-04-14 ENCOUNTER — Other Ambulatory Visit: Payer: Self-pay | Admitting: Family Medicine

## 2019-04-14 DIAGNOSIS — N6310 Unspecified lump in the right breast, unspecified quadrant: Secondary | ICD-10-CM

## 2019-04-21 ENCOUNTER — Ambulatory Visit
Admission: RE | Admit: 2019-04-21 | Discharge: 2019-04-21 | Disposition: A | Discharge status: Home / Self Care | Payer: BC Managed Care – PPO | Source: Ambulatory Visit | Attending: Family Medicine | Admitting: Family Medicine

## 2019-04-21 ENCOUNTER — Other Ambulatory Visit: Payer: Self-pay

## 2019-04-21 DIAGNOSIS — N6310 Unspecified lump in the right breast, unspecified quadrant: Secondary | ICD-10-CM

## 2020-01-07 IMAGING — MG MM DIGITAL DIAGNOSTIC BILAT W/ TOMO W/ CAD
6 of 10 series · 6 of 30 positions shown · non-contrast
Comparison: None.

CLINICAL DATA: Right lower breast area of palpable concern and
tenderness felt by the patient.

EXAM:
DIGITAL DIAGNOSTIC BILATERAL MAMMOGRAM WITH CAD AND TOMO
ULTRASOUND RIGHT BREAST

[R MLO synth-2D (1 of 2)]
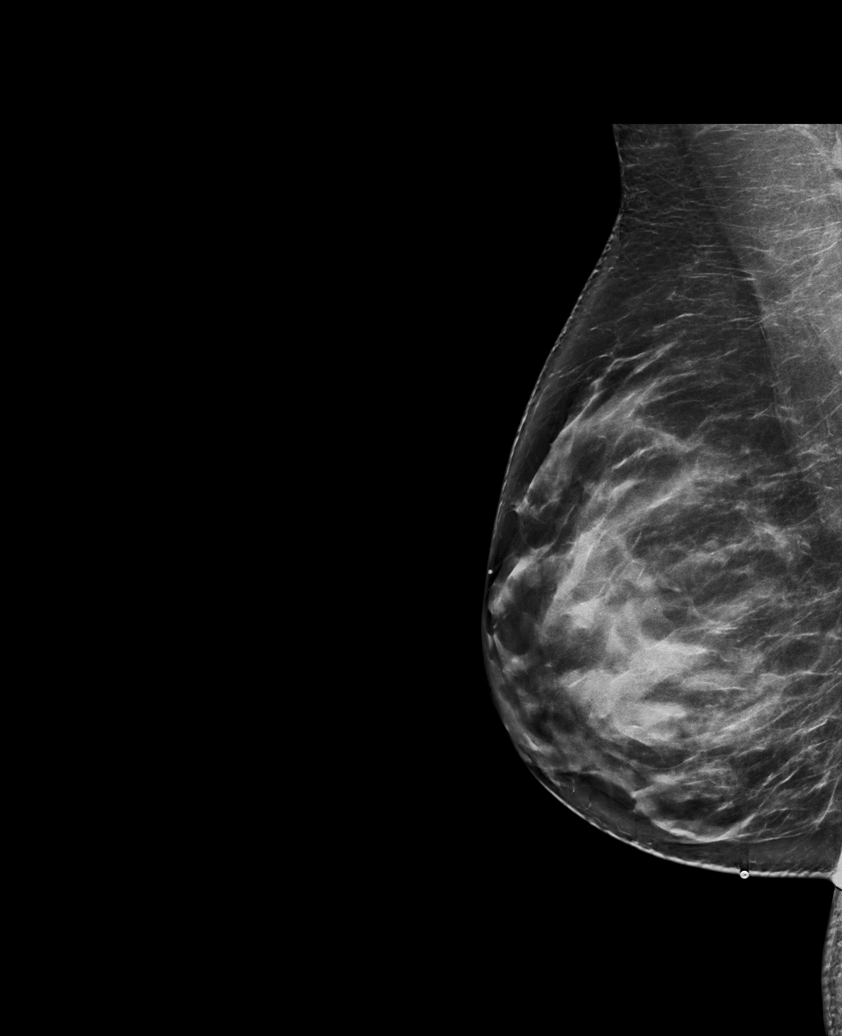

[L MLO synth-2D]
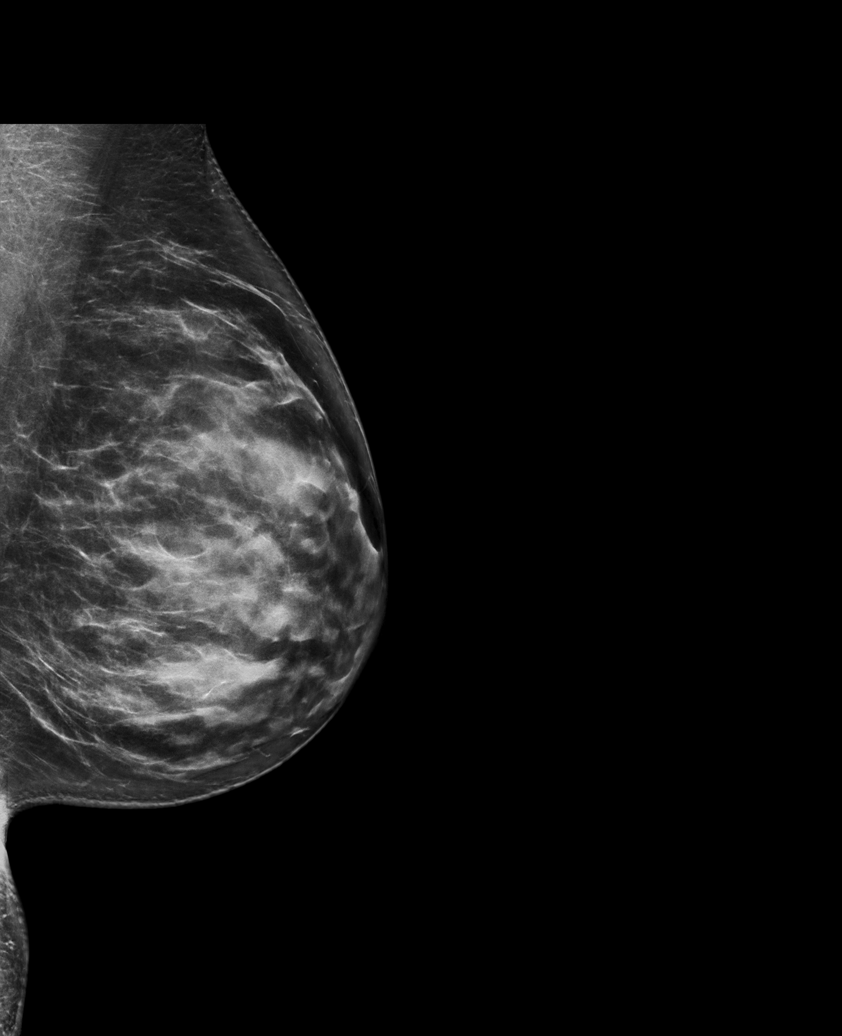

[R CC synth-2D]
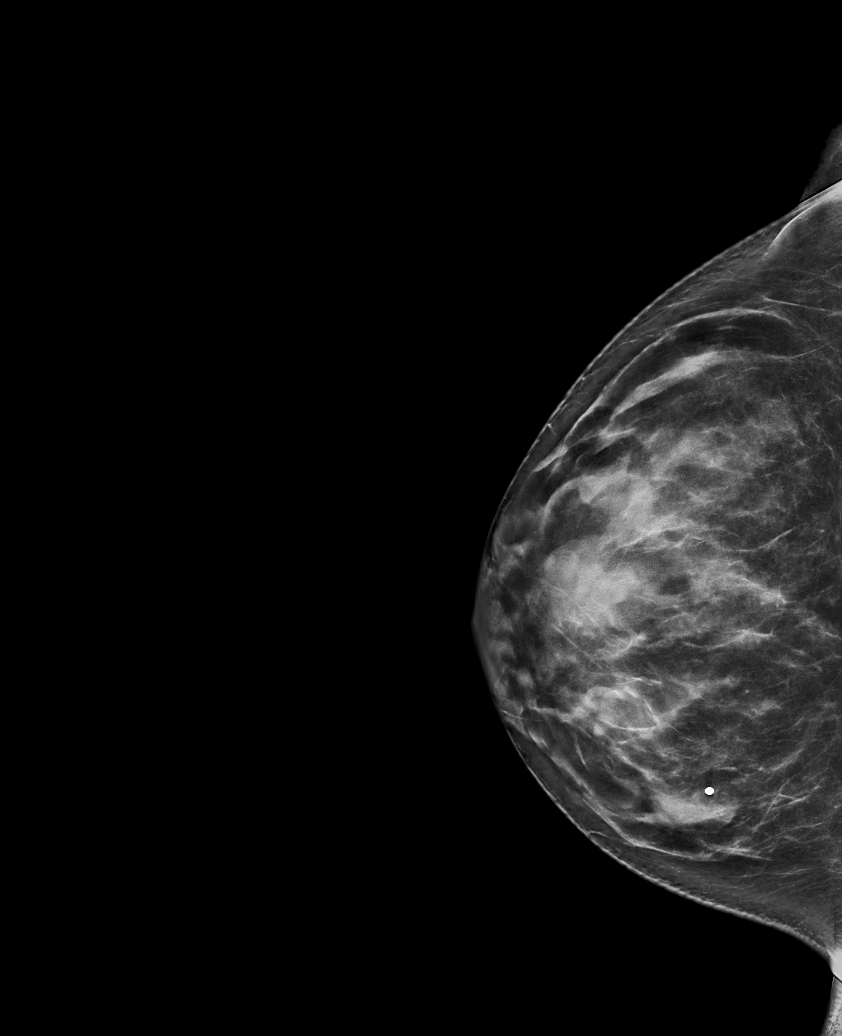

[R MLO synth-2D (2 of 2)]
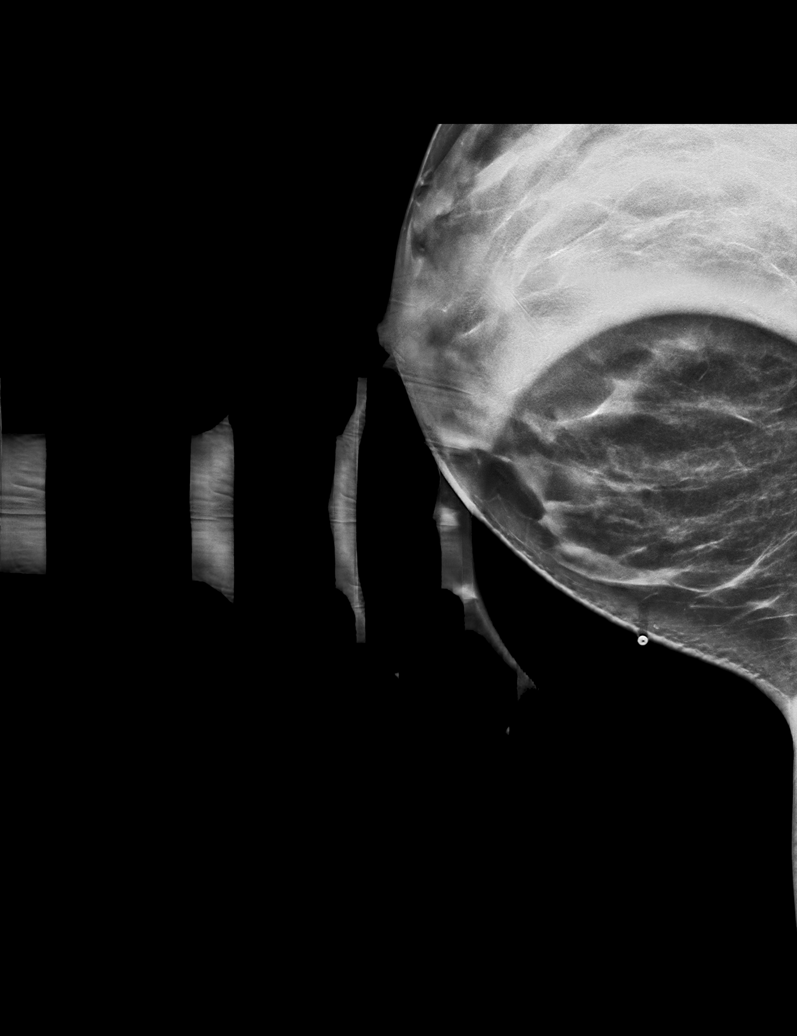

[L CC synth-2D]
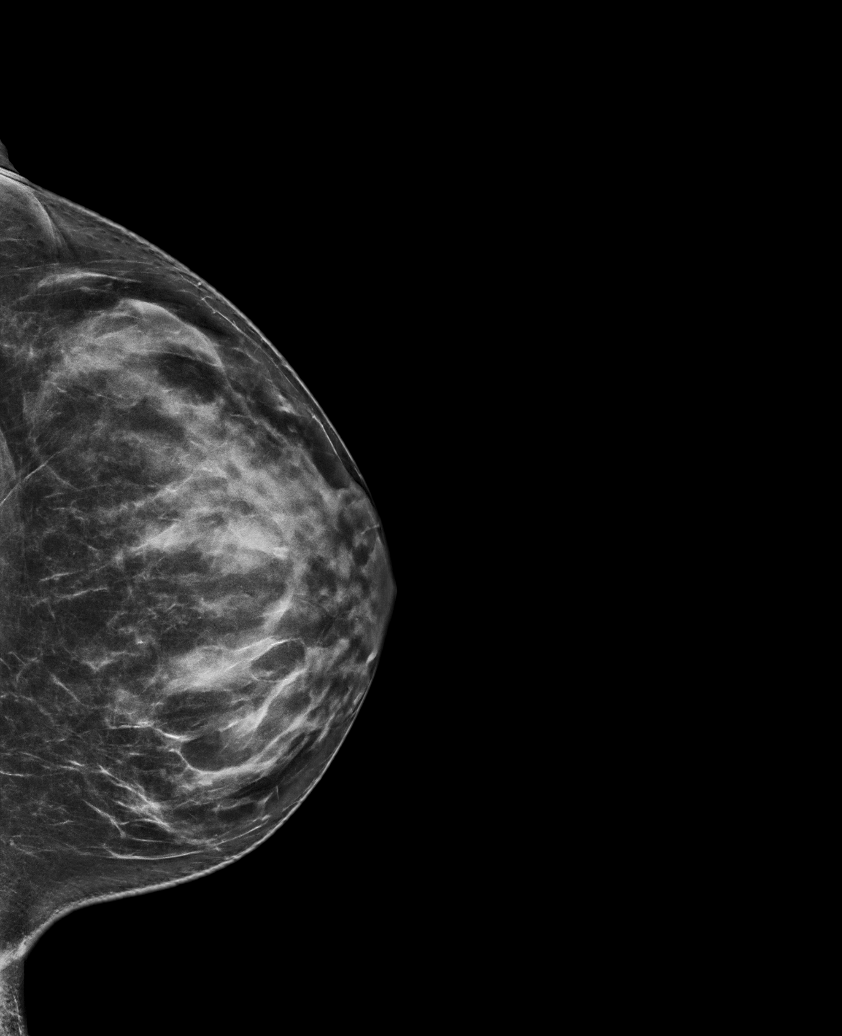

[R CC tomo · tomo slice 43/85.0]
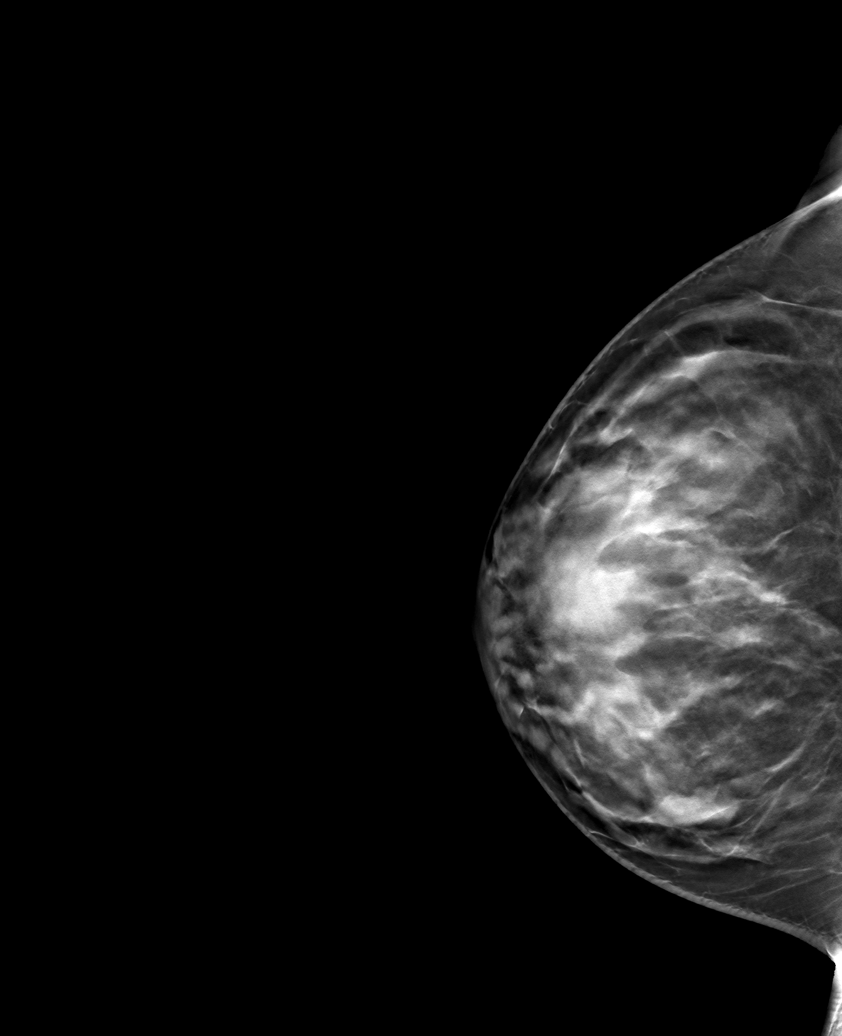

[6 of 30 positions shown; findings below may reference images not displayed]

ACR Breast Density Category c: The breast tissue is heterogeneously
dense, which may obscure small masses.
FINDINGS: Mammographically, there are no suspicious masses, areas of
architectural distortion or microcalcifications in either breast.

Mammographic images were processed with CAD.

On physical exam, no suspicious masses are palpated.

Targeted ultrasound is performed, showing no suspicious masses or
shadowing lesions in the lower right breast.
IMPRESSION: No mammographic or sonographic evidence of malignancy in either
breast.

RECOMMENDATION:
Further management of patient's area of palpable concern and
tenderness in her right lower breast should be based on clinical
grounds.

Otherwise, recommend screening mammogram at age 40 unless there are
persistent or intervening clinical concerns. (Code:[id number]-[id number])

I have discussed the findings and recommendations with the patient.
If applicable, a reminder letter will be sent to the patient
regarding the next appointment.

BI-RADS CATEGORY  1: Negative.

## 2020-01-07 IMAGING — US US BREAST*R* LIMITED INC AXILLA
1 series · 4 of 4 positions shown · non-contrast
Comparison: None.

CLINICAL DATA: Right lower breast area of palpable concern and
tenderness felt by the patient.

EXAM:
DIGITAL DIAGNOSTIC BILATERAL MAMMOGRAM WITH CAD AND TOMO
ULTRASOUND RIGHT BREAST

[Series 1: us breast*right* limited inc axilla · 0.06mm/px · 4 of 4 slices shown]
[im 1/4]
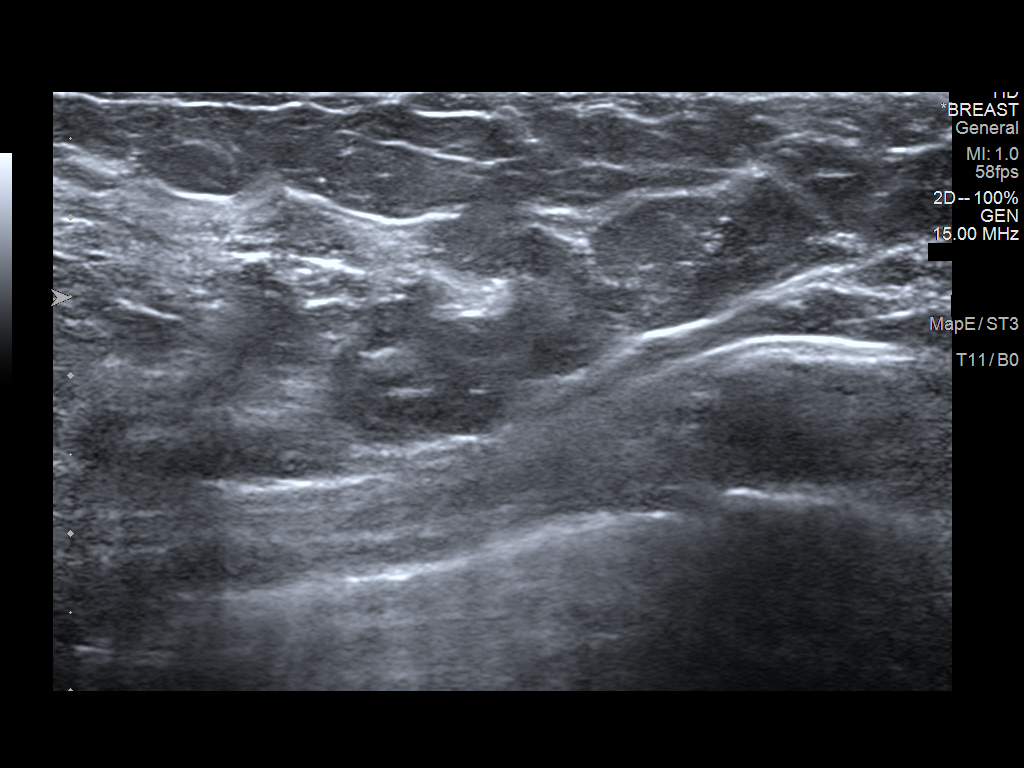
[im 2/4]
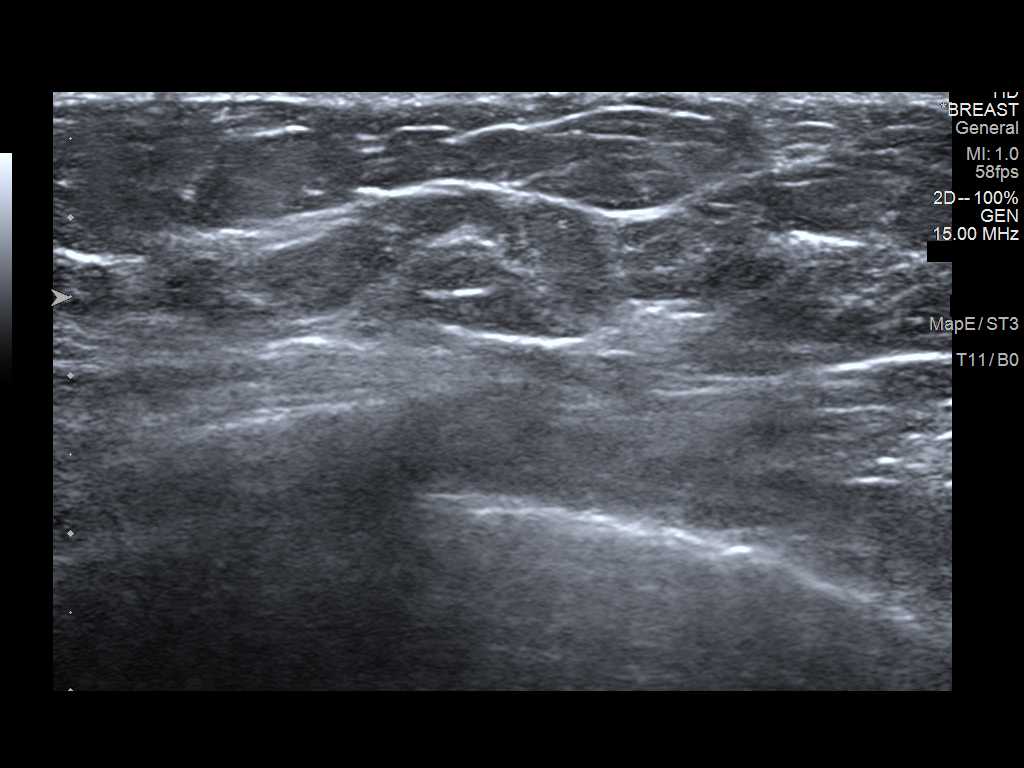
[im 3/4]
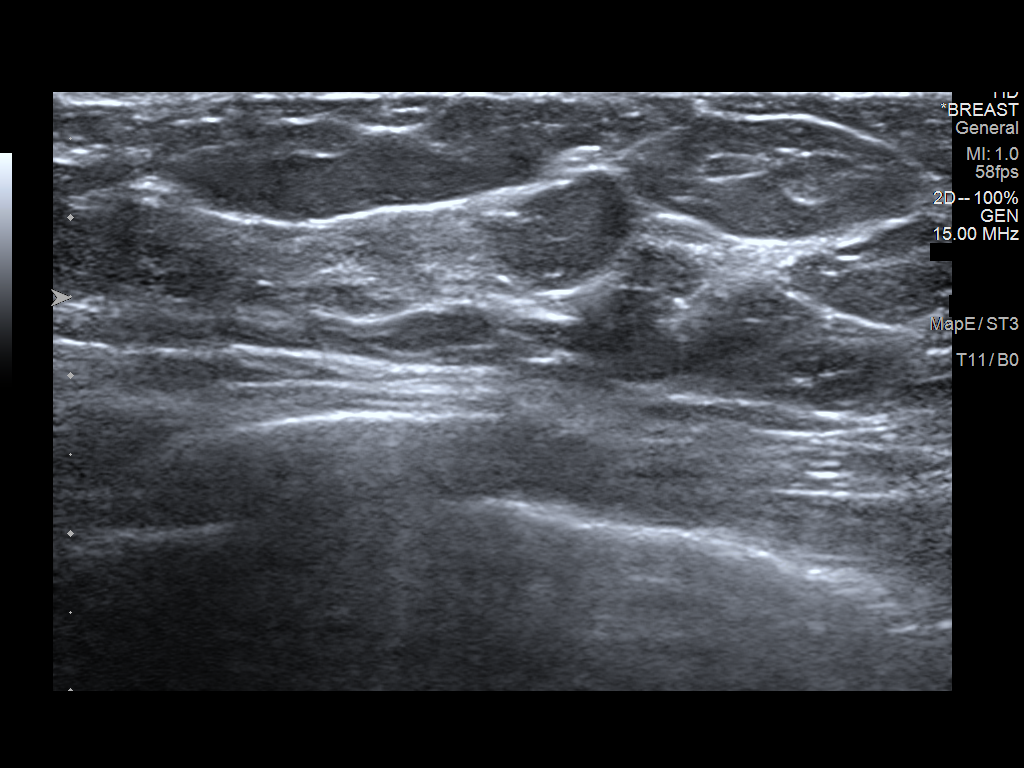
[im 4/4]
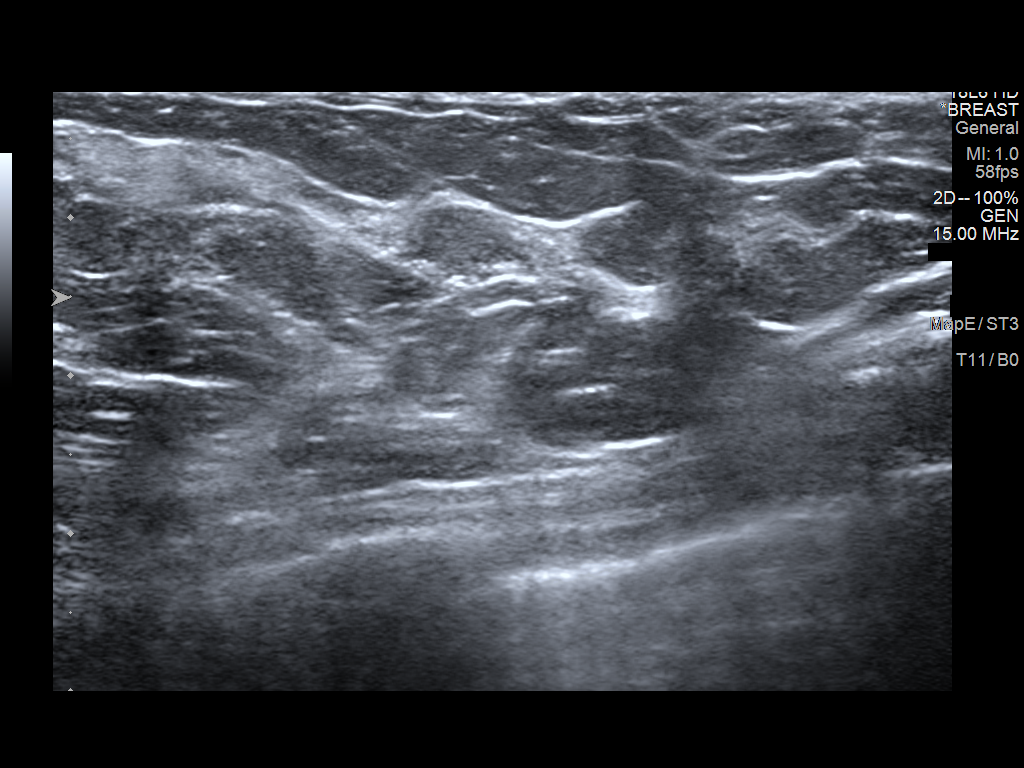

[4 of 4 positions shown; findings below may reference images not displayed]

ACR Breast Density Category c: The breast tissue is heterogeneously
dense, which may obscure small masses.
FINDINGS: Mammographically, there are no suspicious masses, areas of
architectural distortion or microcalcifications in either breast.

Mammographic images were processed with CAD.

On physical exam, no suspicious masses are palpated.

Targeted ultrasound is performed, showing no suspicious masses or
shadowing lesions in the lower right breast.
IMPRESSION: No mammographic or sonographic evidence of malignancy in either
breast.

RECOMMENDATION:
Further management of patient's area of palpable concern and
tenderness in her right lower breast should be based on clinical
grounds.

Otherwise, recommend screening mammogram at age 41 unless there are
persistent or intervening clinical concerns. (Code:[id number]-4YQ)

I have discussed the findings and recommendations with the patient.
If applicable, a reminder letter will be sent to the patient
regarding the next appointment.

BI-RADS CATEGORY  1: Negative.

## 2020-04-12 ENCOUNTER — Other Ambulatory Visit: Payer: Self-pay | Admitting: Family Medicine

## 2020-04-12 DIAGNOSIS — K9049 Malabsorption due to intolerance, not elsewhere classified: Secondary | ICD-10-CM | POA: Diagnosis not present

## 2020-04-12 DIAGNOSIS — K5900 Constipation, unspecified: Secondary | ICD-10-CM | POA: Diagnosis not present

## 2020-04-12 DIAGNOSIS — K581 Irritable bowel syndrome with constipation: Secondary | ICD-10-CM | POA: Diagnosis not present

## 2020-04-13 ENCOUNTER — Other Ambulatory Visit: Payer: BC Managed Care – PPO

## 2020-04-21 ENCOUNTER — Ambulatory Visit
Admission: RE | Admit: 2020-04-21 | Discharge: 2020-04-21 | Disposition: A | Discharge status: Home / Self Care | Payer: BLUE CROSS/BLUE SHIELD | Source: Ambulatory Visit | Attending: Family Medicine | Admitting: Family Medicine

## 2020-04-21 DIAGNOSIS — K9049 Malabsorption due to intolerance, not elsewhere classified: Secondary | ICD-10-CM | POA: Diagnosis not present

## 2020-05-10 DIAGNOSIS — K5900 Constipation, unspecified: Secondary | ICD-10-CM | POA: Diagnosis not present

## 2020-05-10 DIAGNOSIS — E559 Vitamin D deficiency, unspecified: Secondary | ICD-10-CM | POA: Diagnosis not present

## 2020-05-10 DIAGNOSIS — K581 Irritable bowel syndrome with constipation: Secondary | ICD-10-CM | POA: Diagnosis not present

## 2020-05-10 DIAGNOSIS — K9049 Malabsorption due to intolerance, not elsewhere classified: Secondary | ICD-10-CM | POA: Diagnosis not present

## 2020-05-16 DIAGNOSIS — R000 Tachycardia, unspecified: Secondary | ICD-10-CM | POA: Diagnosis not present

## 2020-05-16 DIAGNOSIS — F419 Anxiety disorder, unspecified: Secondary | ICD-10-CM | POA: Diagnosis not present

## 2020-05-16 DIAGNOSIS — Z5321 Procedure and treatment not carried out due to patient leaving prior to being seen by health care provider: Secondary | ICD-10-CM | POA: Diagnosis not present

## 2020-07-22 DIAGNOSIS — Z03818 Encounter for observation for suspected exposure to other biological agents ruled out: Secondary | ICD-10-CM | POA: Diagnosis not present

## 2020-08-11 DIAGNOSIS — Z20822 Contact with and (suspected) exposure to covid-19: Secondary | ICD-10-CM | POA: Diagnosis not present

## 2020-08-11 DIAGNOSIS — Z03818 Encounter for observation for suspected exposure to other biological agents ruled out: Secondary | ICD-10-CM | POA: Diagnosis not present

## 2020-08-27 DIAGNOSIS — Z0000 Encounter for general adult medical examination without abnormal findings: Secondary | ICD-10-CM | POA: Diagnosis not present

## 2020-09-01 DIAGNOSIS — Z0000 Encounter for general adult medical examination without abnormal findings: Secondary | ICD-10-CM | POA: Diagnosis not present

## 2020-09-01 DIAGNOSIS — Z01419 Encounter for gynecological examination (general) (routine) without abnormal findings: Secondary | ICD-10-CM | POA: Diagnosis not present

## 2020-09-01 DIAGNOSIS — Z118 Encounter for screening for other infectious and parasitic diseases: Secondary | ICD-10-CM | POA: Diagnosis not present

## 2020-10-04 DIAGNOSIS — R300 Dysuria: Secondary | ICD-10-CM | POA: Diagnosis not present

## 2020-10-04 DIAGNOSIS — N898 Other specified noninflammatory disorders of vagina: Secondary | ICD-10-CM | POA: Diagnosis not present

## 2020-10-04 DIAGNOSIS — S30814A Abrasion of vagina and vulva, initial encounter: Secondary | ICD-10-CM | POA: Diagnosis not present

## 2020-10-06 DIAGNOSIS — N898 Other specified noninflammatory disorders of vagina: Secondary | ICD-10-CM | POA: Diagnosis not present

## 2020-10-06 DIAGNOSIS — R350 Frequency of micturition: Secondary | ICD-10-CM | POA: Diagnosis not present

## 2020-10-06 DIAGNOSIS — Z202 Contact with and (suspected) exposure to infections with a predominantly sexual mode of transmission: Secondary | ICD-10-CM | POA: Diagnosis not present

## 2020-10-07 ENCOUNTER — Other Ambulatory Visit: Payer: Self-pay | Admitting: Family Medicine

## 2020-10-07 DIAGNOSIS — R109 Unspecified abdominal pain: Secondary | ICD-10-CM

## 2020-10-12 DIAGNOSIS — R768 Other specified abnormal immunological findings in serum: Secondary | ICD-10-CM | POA: Diagnosis not present

## 2020-10-25 ENCOUNTER — Ambulatory Visit
Admission: RE | Admit: 2020-10-25 | Discharge: 2020-10-25 | Disposition: A | Discharge status: Home / Self Care | Payer: BLUE CROSS/BLUE SHIELD | Source: Ambulatory Visit | Attending: Family Medicine | Admitting: Family Medicine

## 2020-10-25 DIAGNOSIS — R109 Unspecified abdominal pain: Secondary | ICD-10-CM

## 2020-12-23 DIAGNOSIS — N926 Irregular menstruation, unspecified: Secondary | ICD-10-CM | POA: Diagnosis not present

## 2020-12-23 DIAGNOSIS — N941 Unspecified dyspareunia: Secondary | ICD-10-CM | POA: Diagnosis not present

## 2020-12-23 DIAGNOSIS — N946 Dysmenorrhea, unspecified: Secondary | ICD-10-CM | POA: Diagnosis not present

## 2020-12-23 DIAGNOSIS — R6882 Decreased libido: Secondary | ICD-10-CM | POA: Diagnosis not present

## 2020-12-31 DIAGNOSIS — Z713 Dietary counseling and surveillance: Secondary | ICD-10-CM | POA: Diagnosis not present

## 2021-01-13 DIAGNOSIS — Z713 Dietary counseling and surveillance: Secondary | ICD-10-CM | POA: Diagnosis not present

## 2021-01-26 DIAGNOSIS — Z713 Dietary counseling and surveillance: Secondary | ICD-10-CM | POA: Diagnosis not present

## 2021-02-17 DIAGNOSIS — Z713 Dietary counseling and surveillance: Secondary | ICD-10-CM | POA: Diagnosis not present

## 2021-03-03 DIAGNOSIS — Z713 Dietary counseling and surveillance: Secondary | ICD-10-CM | POA: Diagnosis not present

## 2021-03-14 DIAGNOSIS — N946 Dysmenorrhea, unspecified: Secondary | ICD-10-CM | POA: Diagnosis not present

## 2021-03-14 DIAGNOSIS — N941 Unspecified dyspareunia: Secondary | ICD-10-CM | POA: Diagnosis not present

## 2021-03-23 DIAGNOSIS — Z713 Dietary counseling and surveillance: Secondary | ICD-10-CM | POA: Diagnosis not present

## 2021-04-15 DIAGNOSIS — Z713 Dietary counseling and surveillance: Secondary | ICD-10-CM | POA: Diagnosis not present

## 2021-05-03 DIAGNOSIS — J069 Acute upper respiratory infection, unspecified: Secondary | ICD-10-CM | POA: Diagnosis not present

## 2021-05-03 DIAGNOSIS — B9689 Other specified bacterial agents as the cause of diseases classified elsewhere: Secondary | ICD-10-CM | POA: Diagnosis not present

## 2021-05-03 DIAGNOSIS — J329 Chronic sinusitis, unspecified: Secondary | ICD-10-CM | POA: Diagnosis not present

## 2021-05-03 DIAGNOSIS — U071 COVID-19: Secondary | ICD-10-CM | POA: Diagnosis not present

## 2021-05-06 ENCOUNTER — Emergency Department (HOSPITAL_BASED_OUTPATIENT_CLINIC_OR_DEPARTMENT_OTHER)
Admission: EM | Admit: 2021-05-06 | Discharge: 2021-05-06 | Disposition: A | Discharge status: Home / Self Care | Payer: BLUE CROSS/BLUE SHIELD | Attending: Emergency Medicine | Admitting: Emergency Medicine

## 2021-05-06 ENCOUNTER — Other Ambulatory Visit: Payer: Self-pay

## 2021-05-06 ENCOUNTER — Encounter (HOSPITAL_BASED_OUTPATIENT_CLINIC_OR_DEPARTMENT_OTHER): Payer: Self-pay

## 2021-05-06 DIAGNOSIS — U071 COVID-19: Secondary | ICD-10-CM | POA: Insufficient documentation

## 2021-05-06 DIAGNOSIS — J45909 Unspecified asthma, uncomplicated: Secondary | ICD-10-CM | POA: Insufficient documentation

## 2021-05-06 DIAGNOSIS — Z7951 Long term (current) use of inhaled steroids: Secondary | ICD-10-CM | POA: Diagnosis not present

## 2021-05-06 DIAGNOSIS — R059 Cough, unspecified: Secondary | ICD-10-CM | POA: Diagnosis not present

## 2021-05-06 DIAGNOSIS — Z91010 Allergy to peanuts: Secondary | ICD-10-CM | POA: Insufficient documentation

## 2021-05-06 MED ORDER — METHOCARBAMOL 500 MG PO TABS: 500.0000 mg | ORAL_TABLET | Freq: Two times a day (BID) | ORAL | 0 refills | Status: AC

## 2021-05-06 MED ORDER — ONDANSETRON 4 MG PO TBDP: 4.0000 mg | ORAL_TABLET | Freq: Three times a day (TID) | ORAL | 0 refills | Status: AC | PRN

## 2021-05-06 NOTE — ED Provider Notes (Addendum)
[facility] EMERGENCY DEPARTMENT Provider Note   CSN: [id number] Arrival date & time: 05/06/21  1557     History Chief Complaint  Patient presents with   Covid Positive    Brandy Macias is a 35 y.o. female.  HPI Patient is a healthy 35 year old female with no pertinent past medical history states that she did have a remote history of asthma as a child no longer has a diagnosis of this  Patient is presented to the ER today with complaints of intermittent hot and cold sensations.  Also felt nauseous Monday evening which is 5 days ago.  Has not had any vomiting.  She states that she was diagnosed 10/17 and started on Paxlovid however she states that she stopped taking this medication yesterday because she was told by her PCP to stop taking it because of the episodic heart palpitations she has had.  She denies any vomiting or diarrhea no chest pain or shortness of breath.  She states she is occasionally having a dry cough states that her symptoms are mild.  No hemoptysis no lower extremity swelling she denies any chest pain but states that she will have occasional episodes where she feels fatigued, clammy and has palpitations.  States that she had an episode like this 5 days ago with another episode 3 days ago.  Seems to happen in the evenings.  During these episodes she will have transient left upper extremity feeling of heaviness.  Denies any weakness.  No other associate symptoms.  No aggravating factors.     Past Medical History:  Diagnosis Date   Allergic rhinitis    Asthma     Patient Active Problem List   Diagnosis Date Noted   Concussion with no loss of consciousness 11/20/2017    History reviewed. No pertinent surgical history.   OB History   No obstetric history on file.     Family History  Problem Relation Age of Onset   Breast cancer Maternal Grandmother    Heart attack Maternal Grandmother    Hypertension Maternal Grandmother     Social  History   Tobacco Use   Smoking status: Never   Smokeless tobacco: Never  Vaping Use   Vaping Use: Never used  Substance Use Topics   Alcohol use: Yes    Comment: occ   Drug use: No    Home Medications Prior to Admission medications   Medication Sig Start Date End Date Taking? Authorizing Provider  methocarbamol (ROBAXIN) 500 MG tablet Take 1 tablet (500 mg total) by mouth 2 (two) times daily. 05/06/21  Yes Patryck Kilgore S, PA  ondansetron (ZOFRAN ODT) 4 MG disintegrating tablet Take 1 tablet (4 mg total) by mouth every 8 (eight) hours as needed for nausea or vomiting. 05/06/21  Yes Orlando Thalmann S, PA  dicyclomine (BENTYL) 20 MG tablet Take 1 tablet (20 mg total) by mouth every 12 (twelve) hours as needed (for abdominal pain/cramping). 06/16/18   Antony Madura, PA-C  guaiFENesin-codeine 100-10 MG/5ML syrup TAKE EVERY 12 HOURS AS NEEDED FOR COUGH 09/17/17   [provider]  omeprazole (PRILOSEC) 20 MG capsule Take 1 capsule (20 mg total) by mouth daily. 06/15/18   Wieters, Hallie C, PA-C  sucralfate (CARAFATE) 1 g tablet Take 1 tablet (1 g total) by mouth 4 (four) times daily -  with meals and at bedtime. 06/15/18   Wieters, Junius Creamer, PA-C  SYMBICORT 80-4.5 MCG/ACT inhaler  08/09/17   [provider]    Allergies  Other and Pecan nut (diagnostic)  Review of Systems   Review of Systems  Constitutional:  Positive for fatigue. Negative for chills and fever.  HENT:  Negative for congestion.   Eyes:  Negative for pain.  Respiratory:  Negative for cough and shortness of breath.   Cardiovascular:  Negative for chest pain and leg swelling.  Gastrointestinal:  Positive for nausea. Negative for abdominal pain, diarrhea and vomiting.  Genitourinary:  Negative for dysuria.  Musculoskeletal:  Negative for myalgias.  Skin:  Negative for rash.  Neurological:  Negative for dizziness and headaches.   Physical Exam Updated Vital Signs BP (!) 139/94 (BP Location: Right  Arm)   Pulse 85   Temp 98.5 F (36.9 C) (Oral)   Resp 18   Ht 5\' 6"  (1.676 m)   Wt 79.4 kg   LMP 05/04/2021   SpO2 99%   BMI 28.25 kg/m   Physical Exam Vitals and nursing note reviewed.  Constitutional:      General: She is not in acute distress. HENT:     Head: Normocephalic and atraumatic.     Nose: Nose normal.     Mouth/Throat:     Mouth: Mucous membranes are moist.  Eyes:     General: No scleral icterus. Cardiovascular:     Rate and Rhythm: Normal rate and regular rhythm.     Pulses: Normal pulses.     Heart sounds: Normal heart sounds.  Pulmonary:     Effort: Pulmonary effort is normal. No respiratory distress.     Breath sounds: No wheezing.     Comments: No tachypnea, no increased work of breathing, speaking full sentences, lungs CTA B Abdominal:     Palpations: Abdomen is soft.     Tenderness: There is no abdominal tenderness.  Musculoskeletal:     Cervical back: Normal range of motion.     Right lower leg: No edema.     Left lower leg: No edema.  Skin:    General: Skin is warm and dry.     Capillary Refill: Capillary refill takes less than 2 seconds.  Neurological:     Mental Status: She is alert. Mental status is at baseline.  Psychiatric:        Mood and Affect: Mood normal.        Behavior: Behavior normal.    ED Results / Procedures / Treatments   Labs (all labs ordered are listed, but only abnormal results are displayed) Labs Reviewed - No data to display  EKG EKG Interpretation  Date/Time:  Friday May 06 2021 16:21:44 EDT Ventricular Rate:  74 PR Interval:  141 QRS Duration: 95 QT Interval:  405 QTC Calculation: 450 R Axis:   40 Text Interpretation: Sinus rhythm No acute ST/Ts No old tracing to compare Confirmed by [id number] [phone]) on 05/06/2021 4:26:28 PM  Radiology No results found.  Procedures Procedures   Medications Ordered in ED Medications - No data to display  ED Course  I have reviewed the triage vital  signs and the nursing notes.  Pertinent labs & imaging results that were available during my care of the patient were reviewed by me and considered in my medical decision making (see chart for details).    MDM Rules/Calculators/A&P                          Patient diagnosed with COVID-19 4 days ago by PCP  She states that she was started on Paxlovid  which she began taking 3 days ago.  States that she feels well currently has no symptoms other than from some fatigue however she has had intermittent episodes of feeling clammy, hot and cold and having some heart palpitations at home.  She denies any chest pain shortness of breath lightheadedness or dizziness.  States that she feels well currently.  We do lengthy shared decision-making conversation about additional labs/imaging.  She states that she mostly came because sometimes in the evenings she has been having episodes overnight where she feels that her arm feels somewhat heavy and numb but has normal sensation and strength in that she states during these episodes she will be feeling clammy and be having heart palpitations usually occur at night.  She states that this did not occur last night.  She has not any symptoms currently.  Denies any hemoptysis chest pain or shortness of breath.  We will suspicion for PE or other emergent condition.  Doubt myocarditis given that she has not had any chest pain or shortness of breath or symptoms seem to be occurring primarily at night.  We discussed also possibilities of things such as anxiety versus muscular pain versus this being due to COVID-19.  Again offered labs however patient declined which I think is reasonable.  I provided patient very strict return precautions she is agreeable to plan.  Will prescribe patient muscle relaxer and Zofran.  Recommended close follow-up with PCP.  Final Clinical Impression(s) / ED Diagnoses Final diagnoses:  COVID-19    Rx / DC Orders ED Discharge Orders           Ordered    methocarbamol (ROBAXIN) 500 MG tablet  2 times daily        05/06/21 1719    ondansetron (ZOFRAN ODT) 4 MG disintegrating tablet  Every 8 hours PRN        05/06/21 1719             Gailen Shelter, PA 05/06/21 1726    Gailen Shelter, [place] 05/06/21 1726    Terrilee Files, MD 05/07/21 1109

## 2021-05-06 NOTE — ED Triage Notes (Addendum)
Pt states she tested +covid 10/17-she has been having episodes where she becomes hot, pain to left UE and her heart races-NAD-to triage in w/c-pt states she was taking antiviral-was advised to stop med due to c/o per PCP office

## 2021-05-06 NOTE — Discharge Instructions (Addendum)
Please use the Zofran as needed for nausea.  Please drink plenty of water.  I have also prescribed you Robaxin to use I recommend primarily using this at bedtime as it can cause some drowsiness.  Do not operate any heavy machinery while you are taking this medication  Please take Tylenol and ibuprofen as discussed below.  Please monitor your symptoms you may return to the ER at anytime for any new or concerning symptoms.  Please use Tylenol or ibuprofen for pain.  You may use 600 mg ibuprofen every 6 hours or 1000 mg of Tylenol every 6 hours.  You may choose to alternate between the 2.  This would be most effective.  Not to exceed 4 g of Tylenol within 24 hours.  Not to exceed 3200 mg ibuprofen 24 hours.

## 2021-05-06 NOTE — ED Notes (Signed)
Pt NAD, a/ox4. Pt verbalizes understanding of all DC and f/u instructions. All questions answered. Pt walks with steady gait to lobby at DC.  ? ?

## 2021-05-10 DIAGNOSIS — U071 COVID-19: Secondary | ICD-10-CM | POA: Diagnosis not present
